# Patient Record
Sex: Male | Born: 1965 | Race: Black or African American | Hispanic: No | Marital: Single | Smoking: Never smoker
Health system: Southern US, Community
[De-identification: ages and names within clinical notes are randomized; demographics above are authoritative.]

## PROBLEM LIST (undated history)

## (undated) HISTORY — PX: HERNIA REPAIR: SHX51

---

## 1997-07-10 ENCOUNTER — Ambulatory Visit (HOSPITAL_BASED_OUTPATIENT_CLINIC_OR_DEPARTMENT_OTHER): Admission: RE | Admit: 1997-07-10 | Discharge: 1997-07-10 | Payer: Self-pay | Admitting: General Surgery

## 1998-01-15 ENCOUNTER — Emergency Department (HOSPITAL_COMMUNITY): Admission: EM | Admit: 1998-01-15 | Discharge: 1998-01-16 | Payer: Self-pay | Admitting: Emergency Medicine

## 1998-09-27 ENCOUNTER — Emergency Department (HOSPITAL_COMMUNITY): Admission: EM | Admit: 1998-09-27 | Discharge: 1998-09-27 | Payer: Self-pay | Admitting: Emergency Medicine

## 1999-05-05 ENCOUNTER — Emergency Department (HOSPITAL_COMMUNITY): Admission: EM | Admit: 1999-05-05 | Discharge: 1999-05-05 | Payer: Self-pay | Admitting: *Deleted

## 1999-08-16 ENCOUNTER — Emergency Department (HOSPITAL_COMMUNITY): Admission: EM | Admit: 1999-08-16 | Discharge: 1999-08-16 | Payer: Self-pay | Admitting: Emergency Medicine

## 1999-10-15 ENCOUNTER — Emergency Department (HOSPITAL_COMMUNITY): Admission: EM | Admit: 1999-10-15 | Discharge: 1999-10-15 | Payer: Self-pay | Admitting: Emergency Medicine

## 1999-10-15 ENCOUNTER — Encounter: Payer: Self-pay | Admitting: Emergency Medicine

## 1999-12-29 ENCOUNTER — Emergency Department (HOSPITAL_COMMUNITY): Admission: EM | Admit: 1999-12-29 | Discharge: 1999-12-29 | Payer: Self-pay | Admitting: Emergency Medicine

## 2002-02-08 ENCOUNTER — Emergency Department (HOSPITAL_COMMUNITY): Admission: EM | Admit: 2002-02-08 | Discharge: 2002-02-08 | Payer: Self-pay | Admitting: Emergency Medicine

## 2008-09-03 ENCOUNTER — Emergency Department (HOSPITAL_COMMUNITY): Admission: EM | Admit: 2008-09-03 | Discharge: 2008-09-03 | Payer: Self-pay | Admitting: Emergency Medicine

## 2008-12-27 ENCOUNTER — Emergency Department (HOSPITAL_COMMUNITY): Admission: EM | Admit: 2008-12-27 | Discharge: 2008-12-27 | Payer: Self-pay | Admitting: Emergency Medicine

## 2010-04-05 ENCOUNTER — Emergency Department (HOSPITAL_COMMUNITY)
Admission: EM | Admit: 2010-04-05 | Discharge: 2010-04-05 | Payer: Self-pay | Source: Home / Self Care | Admitting: Emergency Medicine

## 2010-06-23 LAB — POCT I-STAT, CHEM 8
BUN: 14 mg/dL (ref 6–23)
Calcium, Ion: 1.13 mmol/L (ref 1.12–1.32)
Chloride: 100 mEq/L (ref 96–112)
Glucose, Bld: 78 mg/dL (ref 70–99)
TCO2: 28 mmol/L (ref 0–100)

## 2010-06-23 LAB — CBC
HCT: 44.8 % (ref 39.0–52.0)
Hemoglobin: 14.9 g/dL (ref 13.0–17.0)
MCHC: 33.3 g/dL (ref 30.0–36.0)
MCV: 85.6 fL (ref 78.0–100.0)
Platelets: 153 10*3/uL (ref 150–400)
RBC: 5.24 MIL/uL (ref 4.22–5.81)
RDW: 14.3 % (ref 11.5–15.5)
WBC: 5.1 10*3/uL (ref 4.0–10.5)

## 2010-06-23 LAB — DIFFERENTIAL
Basophils Absolute: 0 10*3/uL (ref 0.0–0.1)
Basophils Relative: 1 % (ref 0–1)
Eosinophils Absolute: 0.3 10*3/uL (ref 0.0–0.7)
Eosinophils Relative: 6 % — ABNORMAL HIGH (ref 0–5)
Lymphocytes Relative: 32 % (ref 12–46)
Lymphs Abs: 1.6 10*3/uL (ref 0.7–4.0)
Monocytes Absolute: 0.5 10*3/uL (ref 0.1–1.0)
Monocytes Relative: 10 % (ref 3–12)
Neutro Abs: 2.6 10*3/uL (ref 1.7–7.7)
Neutrophils Relative %: 51 % (ref 43–77)

## 2010-06-23 LAB — TSH: TSH: 1.316 u[IU]/mL (ref 0.350–4.500)

## 2010-06-23 LAB — D-DIMER, QUANTITATIVE: D-Dimer, Quant: 0.24 ug/mL-FEU (ref 0.00–0.48)

## 2011-10-18 ENCOUNTER — Ambulatory Visit: Payer: BC Managed Care – PPO

## 2011-10-18 ENCOUNTER — Ambulatory Visit (INDEPENDENT_AMBULATORY_CARE_PROVIDER_SITE_OTHER): Payer: BC Managed Care – PPO | Admitting: Family Medicine

## 2011-10-18 VITALS — BP 100/62 | HR 59 | Temp 97.8°F | Resp 16 | Ht 70.5 in | Wt 155.0 lb

## 2011-10-18 DIAGNOSIS — M79673 Pain in unspecified foot: Secondary | ICD-10-CM

## 2011-10-18 DIAGNOSIS — M79609 Pain in unspecified limb: Secondary | ICD-10-CM

## 2011-10-18 MED ORDER — PREDNISONE 20 MG PO TABS: ORAL_TABLET | ORAL | Status: AC

## 2011-10-18 NOTE — Progress Notes (Signed)
[facility] [address] [place] [address] [phone]  Date:  10/18/2011   Name:  Kyle May   DOB:  [birth year]   MRN:  [id number]  PCP:  No primary provider on file.    Chief Complaint: both feet hurt   History of Present Illness:  Kyle May is a 46 y.o. very pleasant male patient who presents with the following:  Here today with foot pain.  He was here about a year ago and treated with prednisone.  This did help a lot- temporarily.  He saw a rheumatologist- he did not get a definite diagnosis (?because he was on prednisone at the time) and he did not follow- up.   He broke a right toe in the past- he sometimes has pain in the old fracture site, and can also have pains in his hands sometimes.    He has had foot pain since he was seen a year ago.  He hurts the most in the morning but an hour or two.   He did not have x-rays at rheumatology that he can recall.    There is no problem list on file for this patient.   No past medical history on file.  No past surgical history on file.  History  Substance Use Topics  . Smoking status: Never Smoker   . Smokeless tobacco: Not on file  . Alcohol Use: No    No family history on file.  Allergies no known allergies  Medication list has been reviewed and updated.  No current outpatient prescriptions on file prior to visit.    Review of Systems:  As per HPI- otherwise negative.   Physical Examination: Filed Vitals:   10/18/11 1440  BP: 100/62  Pulse: 59  Temp: 97.8 F (36.6 C)  Resp: 16   Filed Vitals:   10/18/11 1440  Height: 5' 10.5" (1.791 m)  Weight: 155 lb (70.308 kg)   Body mass index is 21.93 kg/(m^2). Ideal Body Weight: Weight in (lb) to have BMI = 25: 176.4   GEN: WDWN, NAD, Non-toxic, A & O x 3  HEENT: Atraumatic, Normocephalic. Neck supple. No masses, No LAD.  PEERL, oropharynx wnl.   Ears and Nose: No external deformity. CV: RRR, No M/G/R. No JVD. No thrill.  No extra heart sounds. PULM: CTA B, no wheezes, crackles, rhonchi. No retractions. No resp. distress. No accessory muscle use. EXTR: No c/c/e NEURO Normal gait.  PSYCH: Normally interactive. Conversant. Not depressed or anxious appearing.  Calm demeanor.  Feet: diffusey very tender at all IP joints of toes- no swelling or heat, no redness.  Hand joints are normal to my exam   [facility] reading (PRIMARY) by  Dr. Patsy Lager.  Bilateral feet: degenerative changes but no fracture.    Assessment and Plan: 1. Pain of foot  DG Foot 2 Views Left, DG Foot 2 Views Right, predniSONE (DELTASONE) 20 MG tablet   [name] has symptoms and labs that are suspicious for autoimmune disease.  He responded well to prednisone, so will treat with this temporarily.  However, did encourage him to call his rheumatologist and schedule a follow- up.  He will let me know if the prednisone is not helping this time.    Abbe Amsterdam, MD

## 2011-10-19 ENCOUNTER — Encounter: Payer: Self-pay | Admitting: Family Medicine

## 2014-03-03 ENCOUNTER — Ambulatory Visit: Payer: BC Managed Care – PPO | Admitting: Podiatry

## 2014-03-05 ENCOUNTER — Ambulatory Visit: Payer: BC Managed Care – PPO | Admitting: Podiatry

## 2014-04-22 ENCOUNTER — Emergency Department (HOSPITAL_COMMUNITY)
Admission: EM | Admit: 2014-04-22 | Discharge: 2014-04-22 | Disposition: A | Discharge status: Home / Self Care | Payer: Managed Care, Other (non HMO) | Attending: Emergency Medicine | Admitting: Emergency Medicine

## 2014-04-22 ENCOUNTER — Emergency Department (HOSPITAL_COMMUNITY): Payer: Managed Care, Other (non HMO)

## 2014-04-22 ENCOUNTER — Encounter (HOSPITAL_COMMUNITY): Payer: Self-pay

## 2014-04-22 DIAGNOSIS — R091 Pleurisy: Secondary | ICD-10-CM

## 2014-04-22 DIAGNOSIS — R0602 Shortness of breath: Secondary | ICD-10-CM | POA: Diagnosis present

## 2014-04-22 LAB — CBC WITH DIFFERENTIAL/PLATELET
BASOS ABS: 0 10*3/uL (ref 0.0–0.1)
Basophils Relative: 0 % (ref 0–1)
EOS ABS: 0 10*3/uL (ref 0.0–0.7)
Eosinophils Relative: 1 % (ref 0–5)
HCT: 40.7 % (ref 39.0–52.0)
Hemoglobin: 13.2 g/dL (ref 13.0–17.0)
LYMPHS ABS: 2.3 10*3/uL (ref 0.7–4.0)
Lymphocytes Relative: 39 % (ref 12–46)
MCH: 27.8 pg (ref 26.0–34.0)
MCHC: 32.4 g/dL (ref 30.0–36.0)
MCV: 85.7 fL (ref 78.0–100.0)
MONOS PCT: 10 % (ref 3–12)
Monocytes Absolute: 0.6 10*3/uL (ref 0.1–1.0)
NEUTROS ABS: 2.9 10*3/uL (ref 1.7–7.7)
Neutrophils Relative %: 50 % (ref 43–77)
PLATELETS: 168 10*3/uL (ref 150–400)
RBC: 4.75 MIL/uL (ref 4.22–5.81)
RDW: 13.8 % (ref 11.5–15.5)
WBC: 5.8 10*3/uL (ref 4.0–10.5)

## 2014-04-22 LAB — BASIC METABOLIC PANEL
ANION GAP: 11 (ref 5–15)
BUN: 11 mg/dL (ref 6–23)
CO2: 26 mmol/L (ref 19–32)
Calcium: 8.5 mg/dL (ref 8.4–10.5)
Chloride: 103 mmol/L (ref 96–112)
Creatinine, Ser: 0.94 mg/dL (ref 0.50–1.35)
GFR calc non Af Amer: >90 mL/min (ref >90)
Glucose, Bld: 68 mg/dL — ABNORMAL LOW (ref 70–99)
Potassium: 3.6 mmol/L (ref 3.5–5.1)
Sodium: 140 mmol/L (ref 135–145)

## 2014-04-22 LAB — TROPONIN I: Troponin I: <0.03 ng/mL (ref <0.031)

## 2014-04-22 LAB — D-DIMER, QUANTITATIVE: D-Dimer, Quant: 0.42 ug/mL-FEU (ref 0.00–0.48)

## 2014-04-22 MED ORDER — IPRATROPIUM BROMIDE 0.02 % IN SOLN
0.5000 mg | Freq: Once | RESPIRATORY_TRACT | Status: AC
  Administered 2014-04-22: 0.5 mg via RESPIRATORY_TRACT
  Filled 2014-04-22: qty 2.5

## 2014-04-22 MED ORDER — NAPROXEN 500 MG PO TABS: 500.0000 mg | ORAL_TABLET | Freq: Two times a day (BID) | ORAL | Status: AC

## 2014-04-22 MED ORDER — ALBUTEROL SULFATE (2.5 MG/3ML) 0.083% IN NEBU
5.0000 mg | INHALATION_SOLUTION | Freq: Once | RESPIRATORY_TRACT | Status: AC
  Administered 2014-04-22: 5 mg via RESPIRATORY_TRACT
  Filled 2014-04-22: qty 6

## 2014-04-22 NOTE — Discharge Instructions (Signed)
Take Naprosyn as needed for pain. Refer to attached documents for more information. Return to the ED with worsening or concerning symptoms.  °

## 2014-04-22 NOTE — ED Notes (Signed)
Pt states rt rib pain and shortness of breath.  Area is painful to touch.  Denies injury.  Pain worse when lying down.  Denies cough, fever or respiratory symptoms.  Symptoms have been there for 1 week

## 2014-04-22 NOTE — ED Provider Notes (Signed)
CSN: [id number]     Arrival date & time 04/22/14  1125 History   First MD Initiated Contact with Patient 04/22/14 1139     Chief Complaint  Patient presents with  . Shortness of Breath     (Consider location/radiation/quality/duration/timing/severity/associated sxs/prior Treatment) Patient is a 49 y.o. male presenting with shortness of breath. The history is provided by the patient. No language interpreter was used.  Shortness of Breath Severity:  Moderate Onset quality:  Gradual Duration:  1 week Timing:  Constant Progression:  Unchanged Chronicity:  New Context: not activity, not emotional upset, not fumes, not known allergens, not pollens, not strong odors, not URI and not weather changes   Relieved by:  Nothing Worsened by:  Activity (palpation) Ineffective treatments:  Lying down, rest and sitting up Associated symptoms: chest pain   Associated symptoms: no abdominal pain, no claudication, no cough, no fever, no hemoptysis, no neck pain, no syncope and no vomiting   Chest pain:    Quality:  Throbbing   Severity:  Moderate   Onset quality:  Gradual   Duration:  1 week   Timing:  Constant   Progression:  Unchanged   Chronicity:  New Risk factors: no recent alcohol use, no family hx of DVT, no hx of cancer, no hx of PE/DVT, no obesity, no prolonged immobilization, no recent surgery and no tobacco use   Risk factors comment:  Brace on lower extremity   History reviewed. No pertinent past medical history. History reviewed. No pertinent past surgical history. History reviewed. No pertinent family history. History  Substance Use Topics  . Smoking status: Never Smoker   . Smokeless tobacco: Not on file  . Alcohol Use: No    Review of Systems  Constitutional: Negative for fever, chills and fatigue.  HENT: Negative for trouble swallowing.   Eyes: Negative for visual disturbance.  Respiratory: Positive for shortness of breath. Negative for cough and hemoptysis.    Cardiovascular: Positive for chest pain. Negative for palpitations, claudication and syncope.  Gastrointestinal: Negative for nausea, vomiting, abdominal pain and diarrhea.  Genitourinary: Negative for dysuria and difficulty urinating.  Musculoskeletal: Negative for arthralgias and neck pain.  Skin: Negative for color change.  Neurological: Negative for dizziness and weakness.  Psychiatric/Behavioral: Negative for dysphoric mood.      Allergies  Review of patient's allergies indicates no known allergies.  Home Medications   Prior to Admission medications   Medication Sig Start Date End Date Taking? Authorizing Provider  ibuprofen (ADVIL,MOTRIN) 200 MG tablet Take 200-400 mg by mouth every 6 (six) hours as needed for moderate pain.   Yes Historical Provider, MD   BP 123/80 mmHg  Pulse 90  Temp(Src) 98 F (36.7 C) (Oral)  Resp 18  SpO2 100% Physical Exam  Constitutional: He is oriented to person, place, and time. He appears well-developed and well-nourished. No distress.  HENT:  Head: Normocephalic and atraumatic.  Eyes: Conjunctivae and EOM are normal.  Neck: Normal range of motion.  Cardiovascular: Normal rate and regular rhythm.  Exam reveals no gallop and no friction rub.   No murmur heard. Pulmonary/Chest: Effort normal and breath sounds normal. He has no wheezes. He has no rales. He exhibits no tenderness.  Abdominal: Soft. He exhibits no distension. There is no tenderness. There is no rebound.  Musculoskeletal: Normal range of motion.  No calf swelling or tenderness to palpation.   Neurological: He is alert and oriented to person, place, and time. Coordination normal.  Speech is goal-oriented. Moves  limbs without ataxia.   Skin: Skin is warm and dry.  Psychiatric: He has a normal mood and affect. His behavior is normal.  Nursing note and vitals reviewed.   ED Course  Procedures (including critical care time) Labs Review Labs Reviewed  BASIC METABOLIC PANEL -  Abnormal; Notable for the following:    Glucose, Bld 68 (*)    All other components within normal limits  CBC WITH DIFFERENTIAL/PLATELET  D-DIMER, QUANTITATIVE  TROPONIN I    Imaging Review Dg Chest 2 View  04/22/2014   CLINICAL DATA:  49 year old male with increasing shortness of Breath and chest pain more so on the right side. Initial encounter.  EXAM: CHEST  2 VIEW  COMPARISON:  12/27/2008.  FINDINGS: Lung volumes are stable and within normal limits. Normal cardiac size and mediastinal contours. Visualized tracheal air column is within normal limits. No pneumothorax, pulmonary edema, pleural effusion or confluent pulmonary opacity.  IMPRESSION: Negative, no acute cardiopulmonary abnormality.   Electronically Signed   By: Augusto Gamble M.D.   On: 04/22/2014 12:44     EKG Interpretation None      MDM   Final diagnoses:  Pleurisy    12:25 PM  Chest xray pending. Labs pending. Vitals stable and patient afebrile.   1:47 PM Patient's labs and chest xray unremarkable for acute changes. D-dimer negative. Patient likely has pleurisy from recent virus. Patient will be discharged with naprosyn for pain. Patient instructed to return with worsening or concerning symptoms.   Emilia Beck, PA-C 04/25/14 0006  Juliet Rude. Rubin Payor, MD 04/27/14 [phone]

## 2016-02-23 ENCOUNTER — Emergency Department (HOSPITAL_BASED_OUTPATIENT_CLINIC_OR_DEPARTMENT_OTHER): Payer: Worker's Compensation

## 2016-02-23 ENCOUNTER — Encounter (HOSPITAL_BASED_OUTPATIENT_CLINIC_OR_DEPARTMENT_OTHER): Payer: Self-pay | Admitting: Emergency Medicine

## 2016-02-23 ENCOUNTER — Emergency Department (HOSPITAL_BASED_OUTPATIENT_CLINIC_OR_DEPARTMENT_OTHER)
Admission: EM | Admit: 2016-02-23 | Discharge: 2016-02-24 | Disposition: A | Discharge status: Home / Self Care | Payer: Worker's Compensation | Attending: Emergency Medicine | Admitting: Emergency Medicine

## 2016-02-23 DIAGNOSIS — Y929 Unspecified place or not applicable: Secondary | ICD-10-CM | POA: Diagnosis not present

## 2016-02-23 DIAGNOSIS — Y990 Civilian activity done for income or pay: Secondary | ICD-10-CM | POA: Insufficient documentation

## 2016-02-23 DIAGNOSIS — Y939 Activity, unspecified: Secondary | ICD-10-CM | POA: Insufficient documentation

## 2016-02-23 DIAGNOSIS — S62620A Displaced fracture of medial phalanx of right index finger, initial encounter for closed fracture: Secondary | ICD-10-CM | POA: Insufficient documentation

## 2016-02-23 DIAGNOSIS — W230XXA Caught, crushed, jammed, or pinched between moving objects, initial encounter: Secondary | ICD-10-CM | POA: Diagnosis not present

## 2016-02-23 DIAGNOSIS — S6991XA Unspecified injury of right wrist, hand and finger(s), initial encounter: Secondary | ICD-10-CM | POA: Diagnosis present

## 2016-02-23 MED ORDER — HYDROCODONE-ACETAMINOPHEN 5-325 MG PO TABS: 1.0000 | ORAL_TABLET | ORAL | 0 refills | Status: AC | PRN

## 2016-02-23 MED ORDER — OXYCODONE-ACETAMINOPHEN 5-325 MG PO TABS
1.0000 | ORAL_TABLET | Freq: Once | ORAL | Status: DC
  Filled 2016-02-23: qty 1

## 2016-02-23 NOTE — ED Provider Notes (Signed)
[facility] Provider Note   CSN: [id number] Arrival date & time: 02/23/16  2224  By signing my name below, I, Clovis PuAvnee Patel, attest that this documentation has been prepared under the direction and in the presence of Gilda Creasehristopher J Marchella Hibbard, MD  Electronically Signed: Clovis PuAvnee Patel, ED Scribe. 02/23/16. 11:22 PM.   History   Chief Complaint Chief Complaint  Patient presents with  . Hand Injury   The history is provided by the patient. No language interpreter was used.   HPI Comments:  Kyle May is a 50 y.o. male who presents to the Emergency Department complaining of sudden onset, moderate right index finger injury s/p an incident which occurred around 8 PM today. Pt states he caught his finger between a jack. No alleviating factors noted. Pt denies any wounds, any other associated symptoms and modifying factors at this time.   History reviewed. No pertinent past medical history.  There are no active problems to display for this patient.   Past Surgical History:  Procedure Laterality Date  . HERNIA REPAIR        Home Medications    Prior to Admission medications   Medication Sig Start Date End Date Taking? Authorizing Provider  ibuprofen (ADVIL,MOTRIN) 200 MG tablet Take 200-400 mg by mouth every 6 (six) hours as needed for moderate pain.    Historical Provider, MD  naproxen (NAPROSYN) 500 MG tablet Take 1 tablet (500 mg total) by mouth 2 (two) times daily with a meal. 04/22/14   Emilia BeckKaitlyn Szekalski, PA-C    Family History History reviewed. No pertinent family history.  Social History Social History  Substance Use Topics  . Smoking status: Never Smoker  . Smokeless tobacco: Never Used  . Alcohol use No     Allergies   Patient has no known allergies.   Review of Systems Review of Systems  Musculoskeletal: Positive for arthralgias and myalgias.  Neurological: Negative for numbness.  All other systems reviewed and are negative.    Physical  Exam Updated Vital Signs BP 138/85   Pulse 65   Temp 97.7 F (36.5 C) (Oral)   Resp 18   Ht 6' (1.829 m)   Wt 168 lb 4.8 oz (76.3 kg)   SpO2 99%   BMI 22.83 kg/m   Physical Exam  Constitutional: He is oriented to person, place, and time. He appears well-developed and well-nourished. No distress.  HENT:  Head: Normocephalic and atraumatic.  Right Ear: Hearing normal.  Left Ear: Hearing normal.  Nose: Nose normal.  Mouth/Throat: Oropharynx is clear and moist and mucous membranes are normal.  Eyes: Conjunctivae and EOM are normal. Pupils are equal, round, and reactive to light.  Neck: Normal range of motion. Neck supple.  Cardiovascular: Regular rhythm, S1 normal and S2 normal.  Exam reveals no gallop and no friction rub.   No murmur heard. Pulmonary/Chest: Effort normal and breath sounds normal. No respiratory distress. He exhibits no tenderness.  Abdominal: Soft. Normal appearance and bowel sounds are normal. There is no hepatosplenomegaly. There is no tenderness. There is no rebound, no guarding, no tenderness at McBurney's point and negative Murphy's sign. No hernia.  Musculoskeletal: He exhibits edema.  Swelling of the PIP joint of the right second digit with decreased ROM.   Neurological: He is alert and oriented to person, place, and time. He has normal strength. No cranial nerve deficit or sensory deficit. Coordination normal. GCS eye subscore is 4. GCS verbal subscore is 5. GCS motor subscore is 6.  Skin: Skin is warm, dry and intact. No rash noted. No cyanosis.  Small abrasion to dorsal aspect of proximal phalynx  Psychiatric: He has a normal mood and affect. His speech is normal and behavior is normal. Thought content normal.  Nursing note and vitals reviewed.    ED Treatments / Results  DIAGNOSTIC STUDIES:  Oxygen Saturation is 99% on RA, normal by my interpretation.    COORDINATION OF CARE:  11:17 PM Discussed treatment plan with pt at bedside and pt agreed to  plan.  Labs (all labs ordered are listed, but only abnormal results are displayed) Labs Reviewed - No data to display  EKG  EKG Interpretation None       Radiology Dg Finger Index Right  Result Date: 02/23/2016 CLINICAL DATA:  Crush injury in a printing press tonight. EXAM: RIGHT INDEX FINGER 2+V COMPARISON:  None. FINDINGS: Comminuted midshaft fracture of the second middle phalanx with full shaft width radial-palmar displacement. Several comminuted fragments are present around the fracture line. No metallic foreign body. No dislocation. IMPRESSION: Comminuted displaced midshaft fracture of the second middle phalanx. Electronically Signed   By: Ellery Plunkaniel R Mitchell M.D.   On: 02/23/2016 23:16    Procedures Procedures (including critical care time)  Medications Ordered in ED Medications - No data to display   Initial Impression / Assessment and Plan / ED Course  I have reviewed the triage vital signs and the nursing notes.  Pertinent labs & imaging results that were available during my care of the patient were reviewed by me and considered in my medical decision making (see chart for details).  Clinical Course       Final Clinical Impressions(s) / ED Diagnoses   Final diagnoses:  Closed displaced fracture of middle phalanx of right index finger, initial encounter    New Prescriptions New Prescriptions   No medications on file  I personally performed the services described in this documentation, which was scribed in my presence. The recorded information has been reviewed and is accurate.     Gilda Creasehristopher J Jameal Razzano, MD 02/23/16 [phone]

## 2016-02-23 NOTE — ED Triage Notes (Signed)
Right hand injury at work tonight

## 2017-08-10 IMAGING — CR DG FINGER INDEX 2+V*R*
3 series · 3 of 3 positions shown · non-contrast
Comparison: None.

CLINICAL DATA: Crush injury in a printing press tonight.

EXAM:
RIGHT INDEX FINGER 2+V

[x finger pa right]
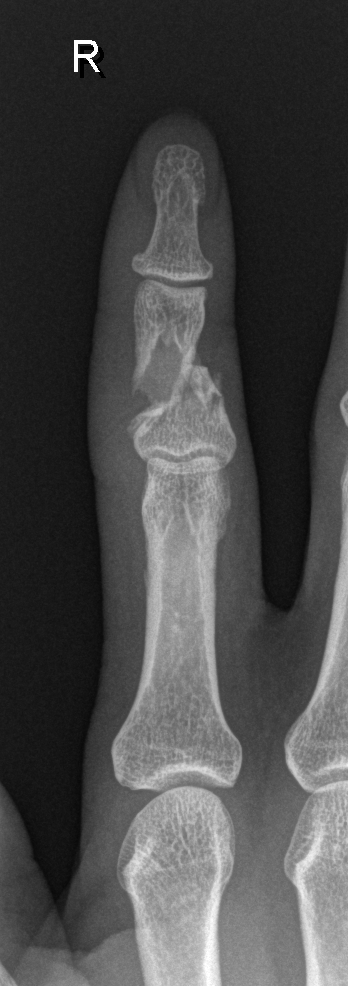

[x finger obl. right]
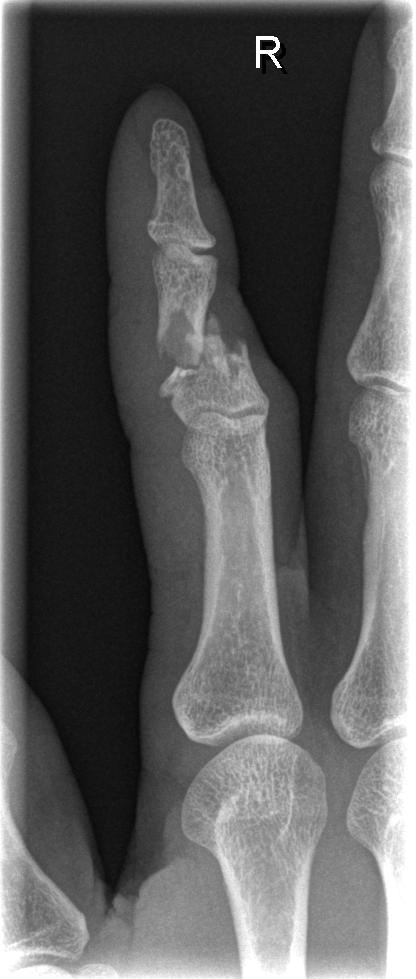

[x finger lateral right]
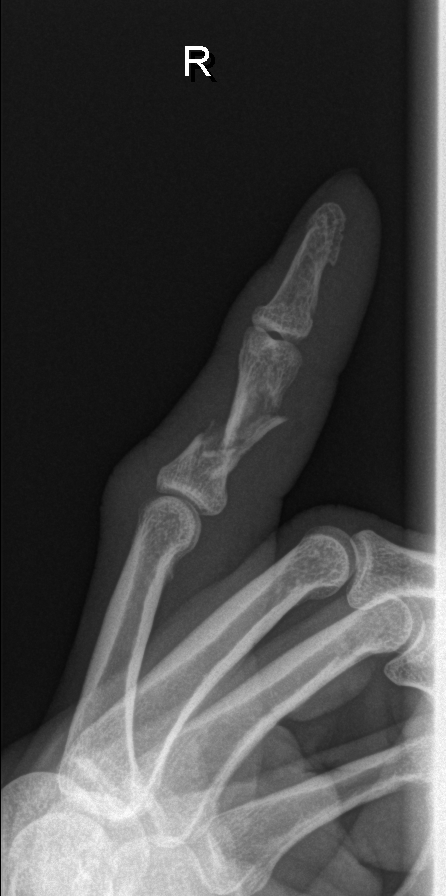

[3 of 3 positions shown; findings below may reference images not displayed]

FINDINGS: Comminuted midshaft fracture of the second middle phalanx with full
shaft width radial-palmar displacement. Several comminuted fragments
are present around the fracture line. No metallic foreign body. No
dislocation.
IMPRESSION: Comminuted displaced midshaft fracture of the second middle phalanx.

## 2018-04-08 ENCOUNTER — Encounter: Payer: Managed Care, Other (non HMO) | Admitting: Podiatry

## 2018-04-21 NOTE — Progress Notes (Signed)
This encounter was created in error - please disregard.

## 2022-12-10 ENCOUNTER — Other Ambulatory Visit: Payer: Self-pay

## 2022-12-10 ENCOUNTER — Encounter (HOSPITAL_BASED_OUTPATIENT_CLINIC_OR_DEPARTMENT_OTHER): Payer: Self-pay

## 2022-12-10 ENCOUNTER — Emergency Department (HOSPITAL_BASED_OUTPATIENT_CLINIC_OR_DEPARTMENT_OTHER): Payer: Managed Care, Other (non HMO)

## 2022-12-10 ENCOUNTER — Emergency Department (HOSPITAL_BASED_OUTPATIENT_CLINIC_OR_DEPARTMENT_OTHER)
Admission: EM | Admit: 2022-12-10 | Discharge: 2022-12-10 | Disposition: A | Discharge status: Home / Self Care | Payer: Managed Care, Other (non HMO) | Attending: Emergency Medicine | Admitting: Emergency Medicine

## 2022-12-10 DIAGNOSIS — E860 Dehydration: Secondary | ICD-10-CM | POA: Diagnosis not present

## 2022-12-10 DIAGNOSIS — R739 Hyperglycemia, unspecified: Secondary | ICD-10-CM | POA: Insufficient documentation

## 2022-12-10 DIAGNOSIS — R109 Unspecified abdominal pain: Secondary | ICD-10-CM | POA: Diagnosis present

## 2022-12-10 DIAGNOSIS — Z202 Contact with and (suspected) exposure to infections with a predominantly sexual mode of transmission: Secondary | ICD-10-CM | POA: Diagnosis not present

## 2022-12-10 LAB — COMPREHENSIVE METABOLIC PANEL
ALT: 11 U/L (ref 0–44)
AST: 28 U/L (ref 15–41)
Albumin: 4 g/dL (ref 3.5–5.0)
Alkaline Phosphatase: 64 U/L (ref 38–126)
Anion gap: 7 (ref 5–15)
BUN: 15 mg/dL (ref 6–20)
CO2: 26 mmol/L (ref 22–32)
Calcium: 8.9 mg/dL (ref 8.9–10.3)
Chloride: 105 mmol/L (ref 98–111)
Creatinine, Ser: 1.06 mg/dL (ref 0.61–1.24)
GFR, Estimated: >60 mL/min (ref >60)
Glucose, Bld: 118 mg/dL — ABNORMAL HIGH (ref 70–99)
Potassium: 3.7 mmol/L (ref 3.5–5.1)
Sodium: 138 mmol/L (ref 135–145)
Total Bilirubin: 0.6 mg/dL (ref 0.3–1.2)
Total Protein: 7 g/dL (ref 6.5–8.1)

## 2022-12-10 LAB — URINALYSIS, ROUTINE W REFLEX MICROSCOPIC
Bilirubin Urine: NEGATIVE
Glucose, UA: NEGATIVE mg/dL
Hgb urine dipstick: NEGATIVE
Ketones, ur: NEGATIVE mg/dL
Leukocytes,Ua: NEGATIVE
Nitrite: NEGATIVE
Protein, ur: NEGATIVE mg/dL
Specific Gravity, Urine: 1.031 — ABNORMAL HIGH (ref 1.005–1.030)
pH: 5.5 (ref 5.0–8.0)

## 2022-12-10 LAB — CBC WITH DIFFERENTIAL/PLATELET
Abs Immature Granulocytes: 0.01 10*3/uL (ref 0.00–0.07)
Basophils Absolute: 0 10*3/uL (ref 0.0–0.1)
Basophils Relative: 1 %
Eosinophils Absolute: 0.2 10*3/uL (ref 0.0–0.5)
Eosinophils Relative: 3 %
HCT: 43.5 % (ref 39.0–52.0)
Hemoglobin: 14.9 g/dL (ref 13.0–17.0)
Immature Granulocytes: 0 %
Lymphocytes Relative: 39 %
Lymphs Abs: 2.1 10*3/uL (ref 0.7–4.0)
MCH: 28.9 pg (ref 26.0–34.0)
MCHC: 34.3 g/dL (ref 30.0–36.0)
MCV: 84.5 fL (ref 80.0–100.0)
Monocytes Absolute: 0.4 10*3/uL (ref 0.1–1.0)
Monocytes Relative: 8 %
Neutro Abs: 2.5 10*3/uL (ref 1.7–7.7)
Neutrophils Relative %: 49 %
Platelets: 147 10*3/uL — ABNORMAL LOW (ref 150–400)
RBC: 5.15 MIL/uL (ref 4.22–5.81)
RDW: 13.2 % (ref 11.5–15.5)
WBC: 5.2 10*3/uL (ref 4.0–10.5)
nRBC: 0 % (ref 0.0–0.2)

## 2022-12-10 LAB — HIV ANTIBODY (ROUTINE TESTING W REFLEX): HIV Screen 4th Generation wRfx: NONREACTIVE

## 2022-12-10 MED ORDER — CEFTRIAXONE SODIUM 500 MG IJ SOLR
500.0000 mg | Freq: Once | INTRAMUSCULAR | Status: AC
  Administered 2022-12-10: 500 mg via INTRAMUSCULAR
  Filled 2022-12-10: qty 500

## 2022-12-10 MED ORDER — DOXYCYCLINE HYCLATE 100 MG PO TABS
100.0000 mg | ORAL_TABLET | Freq: Once | ORAL | Status: AC
  Administered 2022-12-10: 100 mg via ORAL
  Filled 2022-12-10: qty 1

## 2022-12-10 MED ORDER — DOXYCYCLINE HYCLATE 100 MG PO CAPS: 100.0000 mg | ORAL_CAPSULE | Freq: Two times a day (BID) | ORAL | 0 refills | Status: AC

## 2022-12-10 NOTE — ED Provider Notes (Signed)
[facility] AT [facility] Provider Note   CSN: [id number] Arrival date & time: 12/10/22  1609     History Chief Complaint  Patient presents with   Flank Pain    Kyle May is a 57 y.o. male.  Patient presents emergency department concerns of flank pain.  Reports this been ongoing for several weeks without improvement.  Also endorsing some dysuria but denies any hematuria.  Endorses some concern about possible sexual infection as his partner of extended period of time and may have been sexually active with other individuals.  Denies any recent fevers, abdominal pain, nausea, vomiting.  No prior testing for STIs or recent treatment for STIs.    Flank Pain       Home Medications Prior to Admission medications   Medication Sig Start Date End Date Taking? Authorizing Provider  HYDROcodone-acetaminophen (NORCO/VICODIN) 5-325 MG tablet Take 1-2 tablets by mouth every 4 (four) hours as needed for moderate pain. 02/23/16   Gilda Crease, MD  ibuprofen (ADVIL,MOTRIN) 200 MG tablet Take 200-400 mg by mouth every 6 (six) hours as needed for moderate pain.    [provider]  naproxen (NAPROSYN) 500 MG tablet Take 1 tablet (500 mg total) by mouth 2 (two) times daily with a meal. 04/22/14   Emilia Beck, PA-C      Allergies    Atropine    Review of Systems   Review of Systems  Genitourinary:  Positive for flank pain.  All other systems reviewed and are negative.   Physical Exam Updated Vital Signs BP (!) 132/93 (BP Location: Right Arm)   Pulse 76   Temp 98.1 F (36.7 C)   Resp 16   Ht 6' (1.829 m)   Wt 77.1 kg   SpO2 99%   BMI 23.06 kg/m  Physical Exam Vitals and nursing note reviewed.  Constitutional:      General: He is not in acute distress.    Appearance: He is well-developed.  HENT:     Head: Normocephalic and atraumatic.  Eyes:     Conjunctiva/sclera: Conjunctivae normal.  Cardiovascular:     Rate and  Rhythm: Normal rate and regular rhythm.     Heart sounds: No murmur heard. Pulmonary:     Effort: Pulmonary effort is normal. No respiratory distress.     Breath sounds: Normal breath sounds.  Abdominal:     Palpations: Abdomen is soft.     Tenderness: There is no abdominal tenderness. There is no right CVA tenderness or left CVA tenderness.  Musculoskeletal:        General: No swelling.     Cervical back: Neck supple.  Skin:    General: Skin is warm and dry.     Capillary Refill: Capillary refill takes less than 2 seconds.  Neurological:     Mental Status: He is alert.  Psychiatric:        Mood and Affect: Mood normal.     ED Results / Procedures / Treatments   Labs (all labs ordered are listed, but only abnormal results are displayed) Labs Reviewed  URINALYSIS, ROUTINE W REFLEX MICROSCOPIC - Abnormal; Notable for the following components:      Result Value   Specific Gravity, Urine 1.031 (*)    All other components within normal limits  CBC WITH DIFFERENTIAL/PLATELET - Abnormal; Notable for the following components:   Platelets 147 (*)    All other components within normal limits  COMPREHENSIVE METABOLIC PANEL - Abnormal; Notable for  the following components:   Glucose, Bld 118 (*)    All other components within normal limits  HIV ANTIBODY (ROUTINE TESTING W REFLEX)  GC/CHLAMYDIA PROBE AMP ([place]) NOT AT [facility]    EKG None  Radiology CT Renal Stone Study  Result Date: 12/10/2022 CLINICAL DATA:  Flank pain and dysuria for 2 days. EXAM: CT ABDOMEN AND PELVIS WITHOUT CONTRAST TECHNIQUE: Multidetector CT imaging of the abdomen and pelvis was performed following the standard protocol without IV contrast. RADIATION DOSE REDUCTION: This exam was performed according to the departmental dose-optimization program which includes automated exposure control, adjustment of the mA and/or kV according to patient size and/or use of iterative reconstruction technique. COMPARISON:   None Available. FINDINGS: Lower chest: No acute findings. Hepatobiliary: No mass visualized on this unenhanced exam. Gallbladder is unremarkable. No evidence of biliary ductal dilatation. Pancreas: No mass or inflammatory process visualized on this unenhanced exam. Spleen:  Within normal limits in size. Adrenals/Urinary tract: No evidence of urolithiasis or hydronephrosis. Unremarkable unopacified urinary bladder. Stomach/Bowel: No evidence of obstruction, inflammatory process, or abnormal fluid collections. Normal appendix visualized. Vascular/Lymphatic: No pathologically enlarged lymph nodes identified. No evidence of abdominal aortic aneurysm. Reproductive:  No mass or other significant abnormality. Other:  None. Musculoskeletal:  No suspicious bone lesions identified. IMPRESSION: Negative. No evidence of urolithiasis, hydronephrosis, or other acute findings. Electronically Signed   By: Danae Orleans M.D.   On: 12/10/2022 16:58    Procedures Procedures   Medications Ordered in ED Medications  cefTRIAXone (ROCEPHIN) injection 500 mg (500 mg Intramuscular Given 12/10/22 1809)  doxycycline (VIBRA-TABS) tablet 100 mg (100 mg Oral Given 12/10/22 1809)    ED Course/ Medical Decision Making/ A&P                               Medical Decision Making Amount and/or Complexity of Data Reviewed Labs: ordered. Radiology: ordered.  Risk Prescription drug management.   This patient presents to the ED for concern of flank pain.  Differential diagnosis includes UTI, pyelonephritis, urolithiasis, renal colic   Lab Tests:  I Ordered, and personally interpreted labs.  The pertinent results include: CBC unremarkable, CMP with mild hyperglycemia 118, UA without obvious signs of infection and mild dehydration was elevated specific cavity, gonorrhea chlamydia collected and pending, HIV pending   Imaging Studies ordered:  I ordered imaging studies including CT renal stone study I independently visualized  and interpreted imaging which showed negative for any acute findings I agree with the radiologist interpretation   Medicines ordered and prescription drug management:  I ordered medication including Rocephin, doxycycline for suspected STI exposure Reevaluation of the patient after these medicines showed that the patient at the same I have reviewed the patients home medicines and have made adjustments as needed   Problem List / ED Course:  Patient presents to the emergency department concerns of flank pain.  States has been ongoing for several days without improvement.  Also endorsing some associated dysuria.  Concerned about possible STI exposure as he reports concerns of fidelity with a partner.  Denies any hematuria, or other changes in urine flow. Patient labs ordered for evaluation including CBC, CMP, UA.  On repeat evaluation of patient, patient concern for STI testing sent and gonorrhea and Chlamydia added as well as HIV testing. Labs including are unremarkable at this time.  CT renal stone negative for any acute findings to suggest urolithiasis or nephrolithiasis.  [place] includes, renal  colic, STI. Given patient's concern and suspected exposure to STI, will initiate treatment with 1 dose of Rocephin and doxycycline here with continued prescription for doxycycline sent to patient's pharmacy.  Encourage patient to follow-up with primary care provider for repeat evaluation and assessment.  Discussed return precautions.  Patient agreeable to treatment plan verbalized understanding all return precautions.  All questions answered prior to patient discharge.  Patient discharged home in stable condition.   Final Clinical Impression(s) / ED Diagnoses Final diagnoses:  Possible exposure to STI  Flank pain    Rx / DC Orders ED Discharge Orders     None         Smitty Knudsen, PA-C 12/10/22 1829    Virgina Norfolk, DO 12/10/22 2306

## 2022-12-10 NOTE — ED Triage Notes (Signed)
Pt presents with 2 days of bilateral flank pain and dysuria. Denies fever, N/V.

## 2022-12-10 NOTE — Discharge Instructions (Addendum)
You were seen in the ER today for flank pain. Your labs and imaging were unremarkable. Since we have concerns of possible STI exposure, you have been treated with antibiotics for this with antibiotics sent to your pharmacy to continue. Please keep taking these. Return to the ER if symptoms worsen.

## 2022-12-11 LAB — GC/CHLAMYDIA PROBE AMP (~~LOC~~) NOT AT ARMC
Chlamydia: NEGATIVE
Comment: NEGATIVE
Comment: NORMAL
Neisseria Gonorrhea: NEGATIVE
